# Patient Record
Sex: Male | Born: 1970 | Race: Black or African American | Hispanic: No | Marital: Single | Smoking: Never smoker
Health system: Southern US, Community
[De-identification: ages and names within clinical notes are randomized; demographics above are authoritative.]

## PROBLEM LIST (undated history)

## (undated) DIAGNOSIS — I10 Essential (primary) hypertension: Secondary | ICD-10-CM

## (undated) HISTORY — PX: TONSILLECTOMY: SUR1361

---

## 2003-02-15 ENCOUNTER — Emergency Department (HOSPITAL_COMMUNITY): Admission: EM | Admit: 2003-02-15 | Discharge: 2003-02-15 | Payer: Self-pay | Admitting: Emergency Medicine

## 2005-11-04 ENCOUNTER — Emergency Department (HOSPITAL_COMMUNITY): Admission: EM | Admit: 2005-11-04 | Discharge: 2005-11-04 | Payer: Self-pay | Admitting: Emergency Medicine

## 2007-08-01 ENCOUNTER — Emergency Department (HOSPITAL_COMMUNITY): Admission: EM | Admit: 2007-08-01 | Discharge: 2007-08-01 | Payer: Self-pay | Admitting: Emergency Medicine

## 2008-08-25 ENCOUNTER — Emergency Department (HOSPITAL_COMMUNITY): Admission: EM | Admit: 2008-08-25 | Discharge: 2008-08-25 | Payer: Self-pay | Admitting: Emergency Medicine

## 2009-09-23 ENCOUNTER — Emergency Department (HOSPITAL_COMMUNITY): Admission: EM | Admit: 2009-09-23 | Discharge: 2009-09-23 | Payer: Self-pay | Admitting: Emergency Medicine

## 2010-12-25 ENCOUNTER — Ambulatory Visit
Admission: RE | Admit: 2010-12-25 | Discharge: 2010-12-25 | Disposition: A | Discharge status: Home / Self Care | Payer: 59 | Source: Ambulatory Visit | Attending: Family Medicine | Admitting: Family Medicine

## 2010-12-25 ENCOUNTER — Other Ambulatory Visit: Payer: Self-pay | Admitting: Family Medicine

## 2010-12-25 DIAGNOSIS — R059 Cough, unspecified: Secondary | ICD-10-CM

## 2010-12-25 DIAGNOSIS — IMO0001 Reserved for inherently not codable concepts without codable children: Secondary | ICD-10-CM

## 2010-12-25 DIAGNOSIS — R05 Cough: Secondary | ICD-10-CM

## 2012-01-30 IMAGING — CR DG KNEE COMPLETE 4+V*R*
4 series · 4 of 4 positions shown · non-contrast
Comparison: Right knee radiographs performed 11/04/2005

CLINICAL DATA: Status post fall while dancing; right anterior knee
pain.

RIGHT KNEE - COMPLETE 4+ VIEW

[t knee ap right]
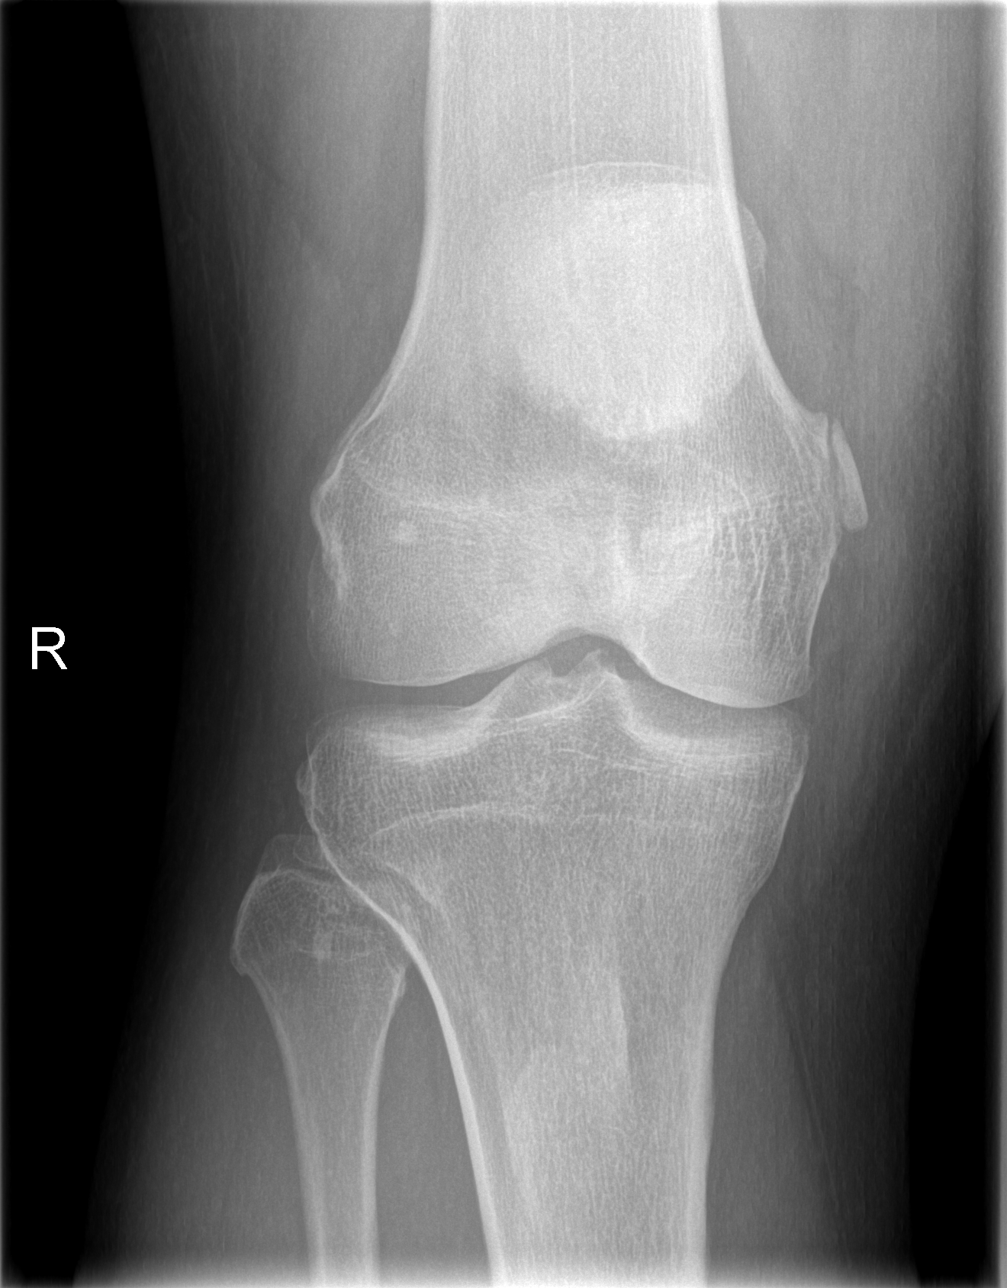

[t knee oblique right (1 of 2)]
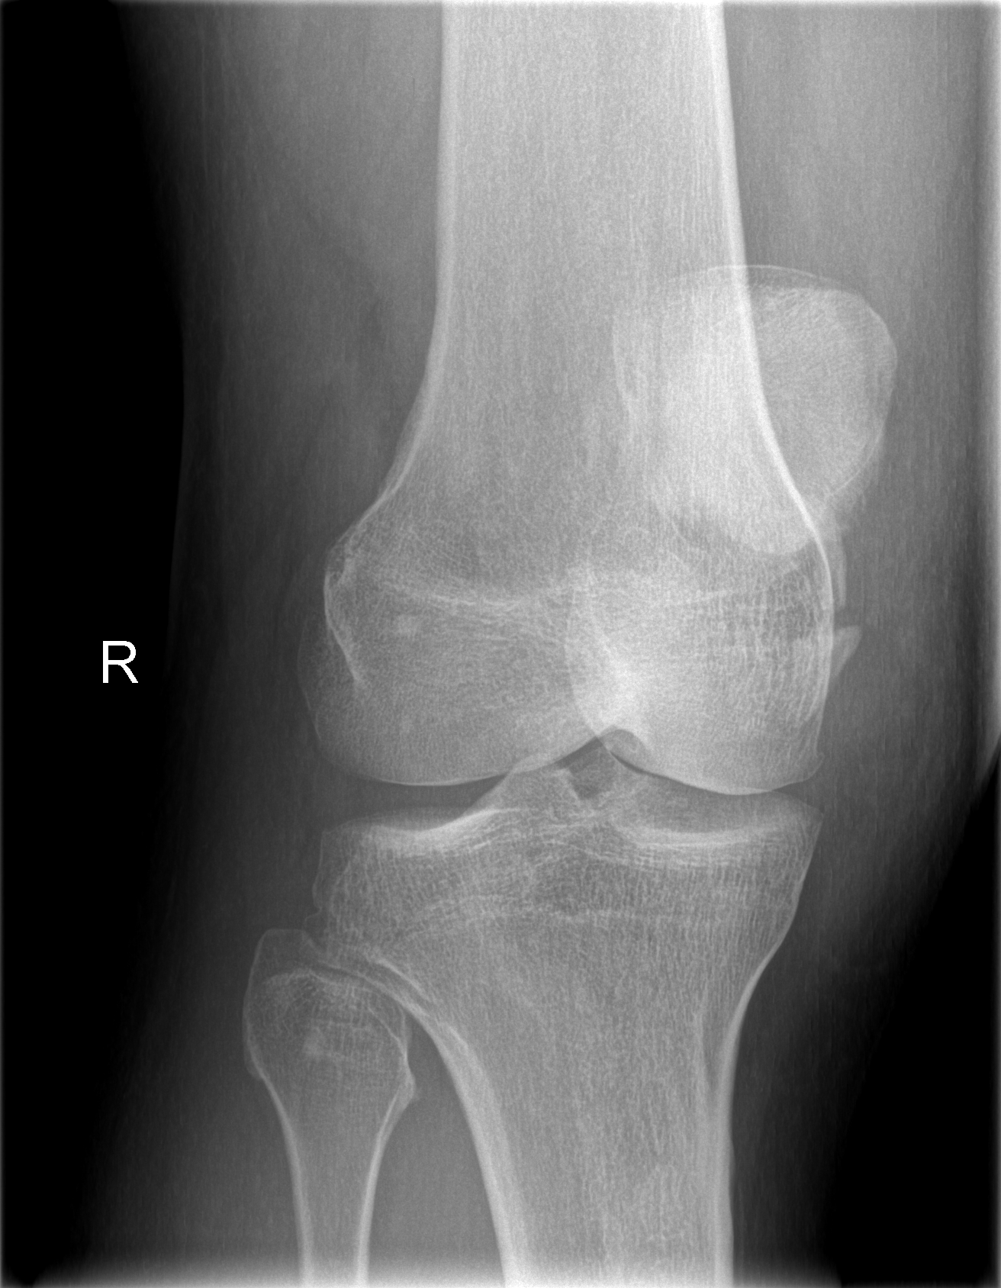

[t knee oblique right (2 of 2)]
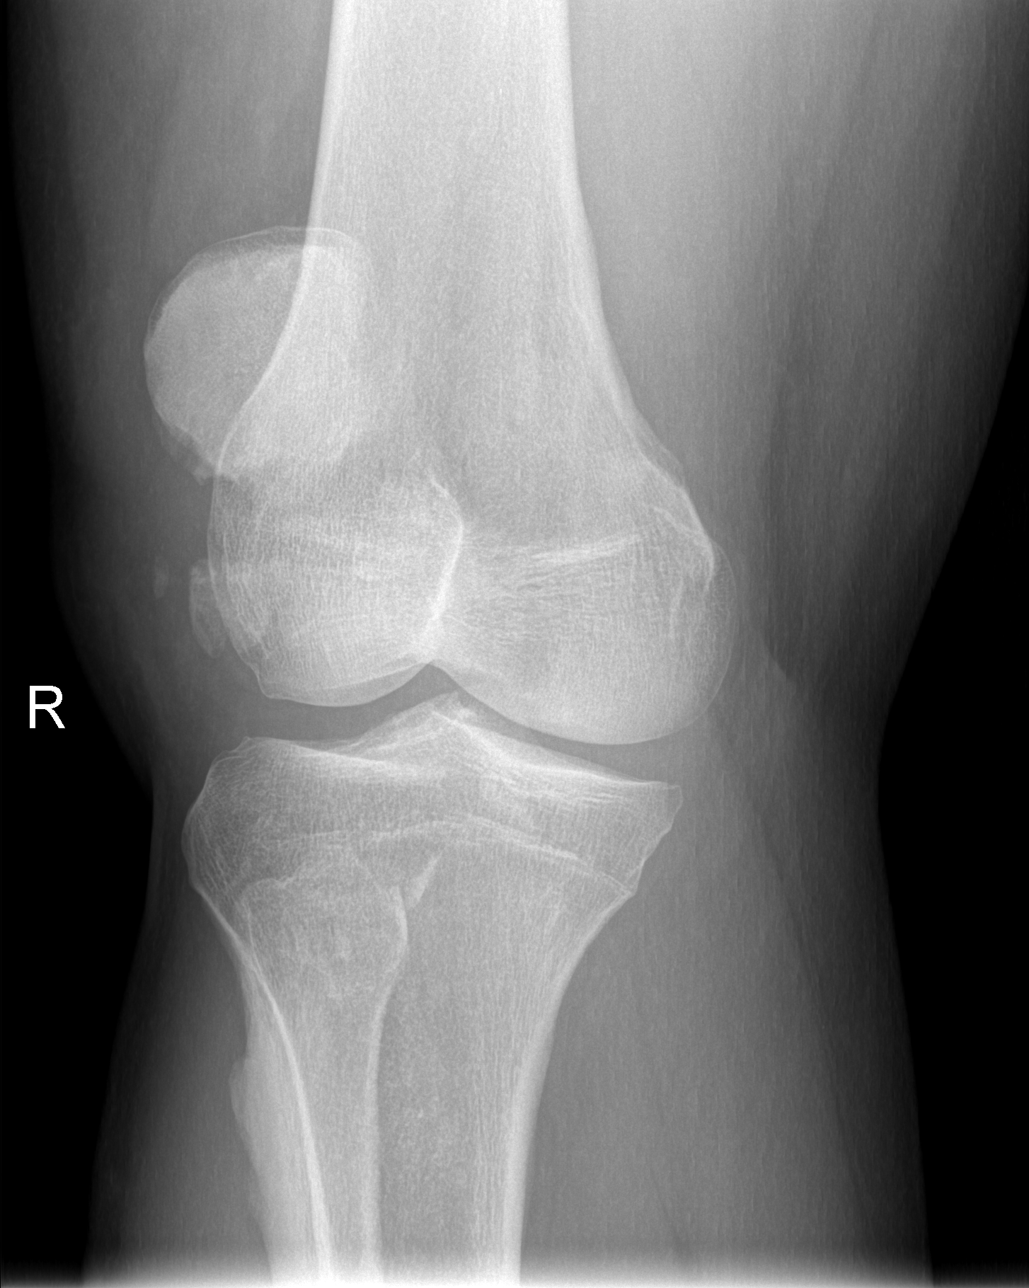

[t knee lat right]
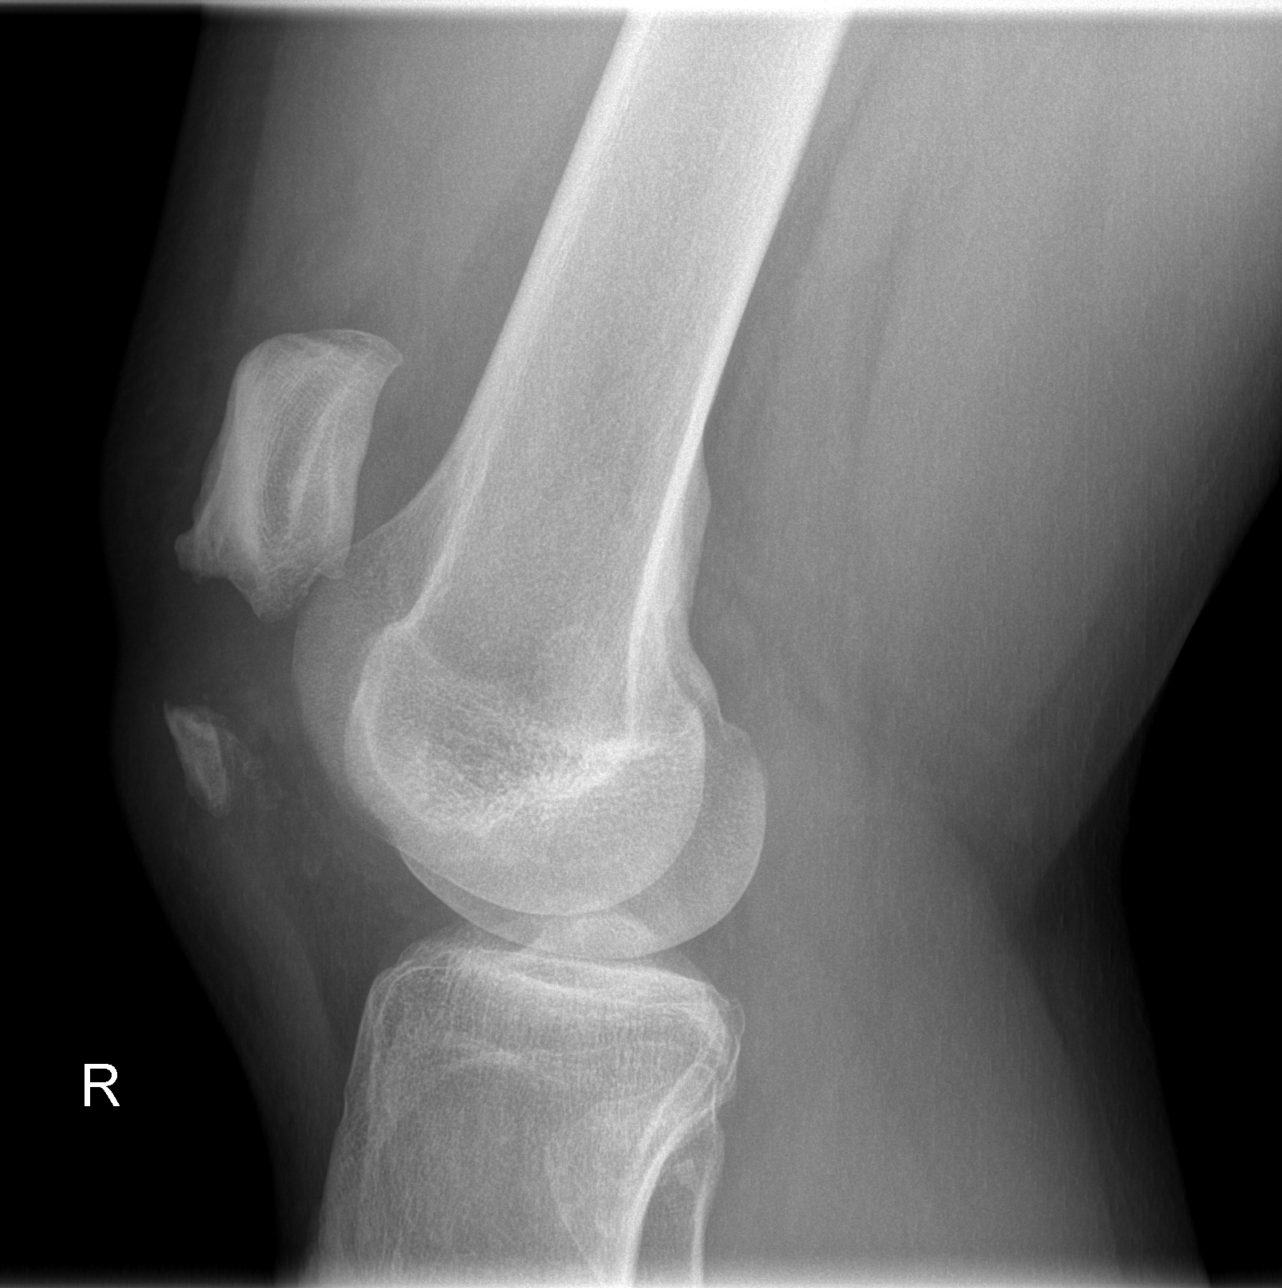

[4 of 4 positions shown; findings below may reference images not displayed]

FINDINGS: There is avulsion of the inferior portion of the
patient's bipartite patella.  New from the prior study, there is
now 2.4 cm inferior displacement of the avulsed fragment.  There is
corresponding superior migration of the majority of the patella.

A Pellegrini-Stieda lesion is noted, reflecting prior medial
collateral ligament injury.  The joint spaces are grossly
preserved.  A small joint effusion is likely present.

The visualized soft tissues are otherwise unremarkable in
appearance.
IMPRESSION: 1.  New avulsion of the inferior portion of the patient's bipartite
patella, with 2.4 cm of separation between the avulsed fragment and
the majority of the patella.
2.  Pellegrini-Stieda lesion again noted, reflecting prior medial
collateral ligament injury.
3.  Likely small joint effusion.

## 2013-05-02 IMAGING — CR DG CHEST 2V
2 series · 2 of 2 positions shown · non-contrast
Comparison: None.

CLINICAL DATA: Cough, congestion

CHEST - 2 VIEW

[w chest pa]
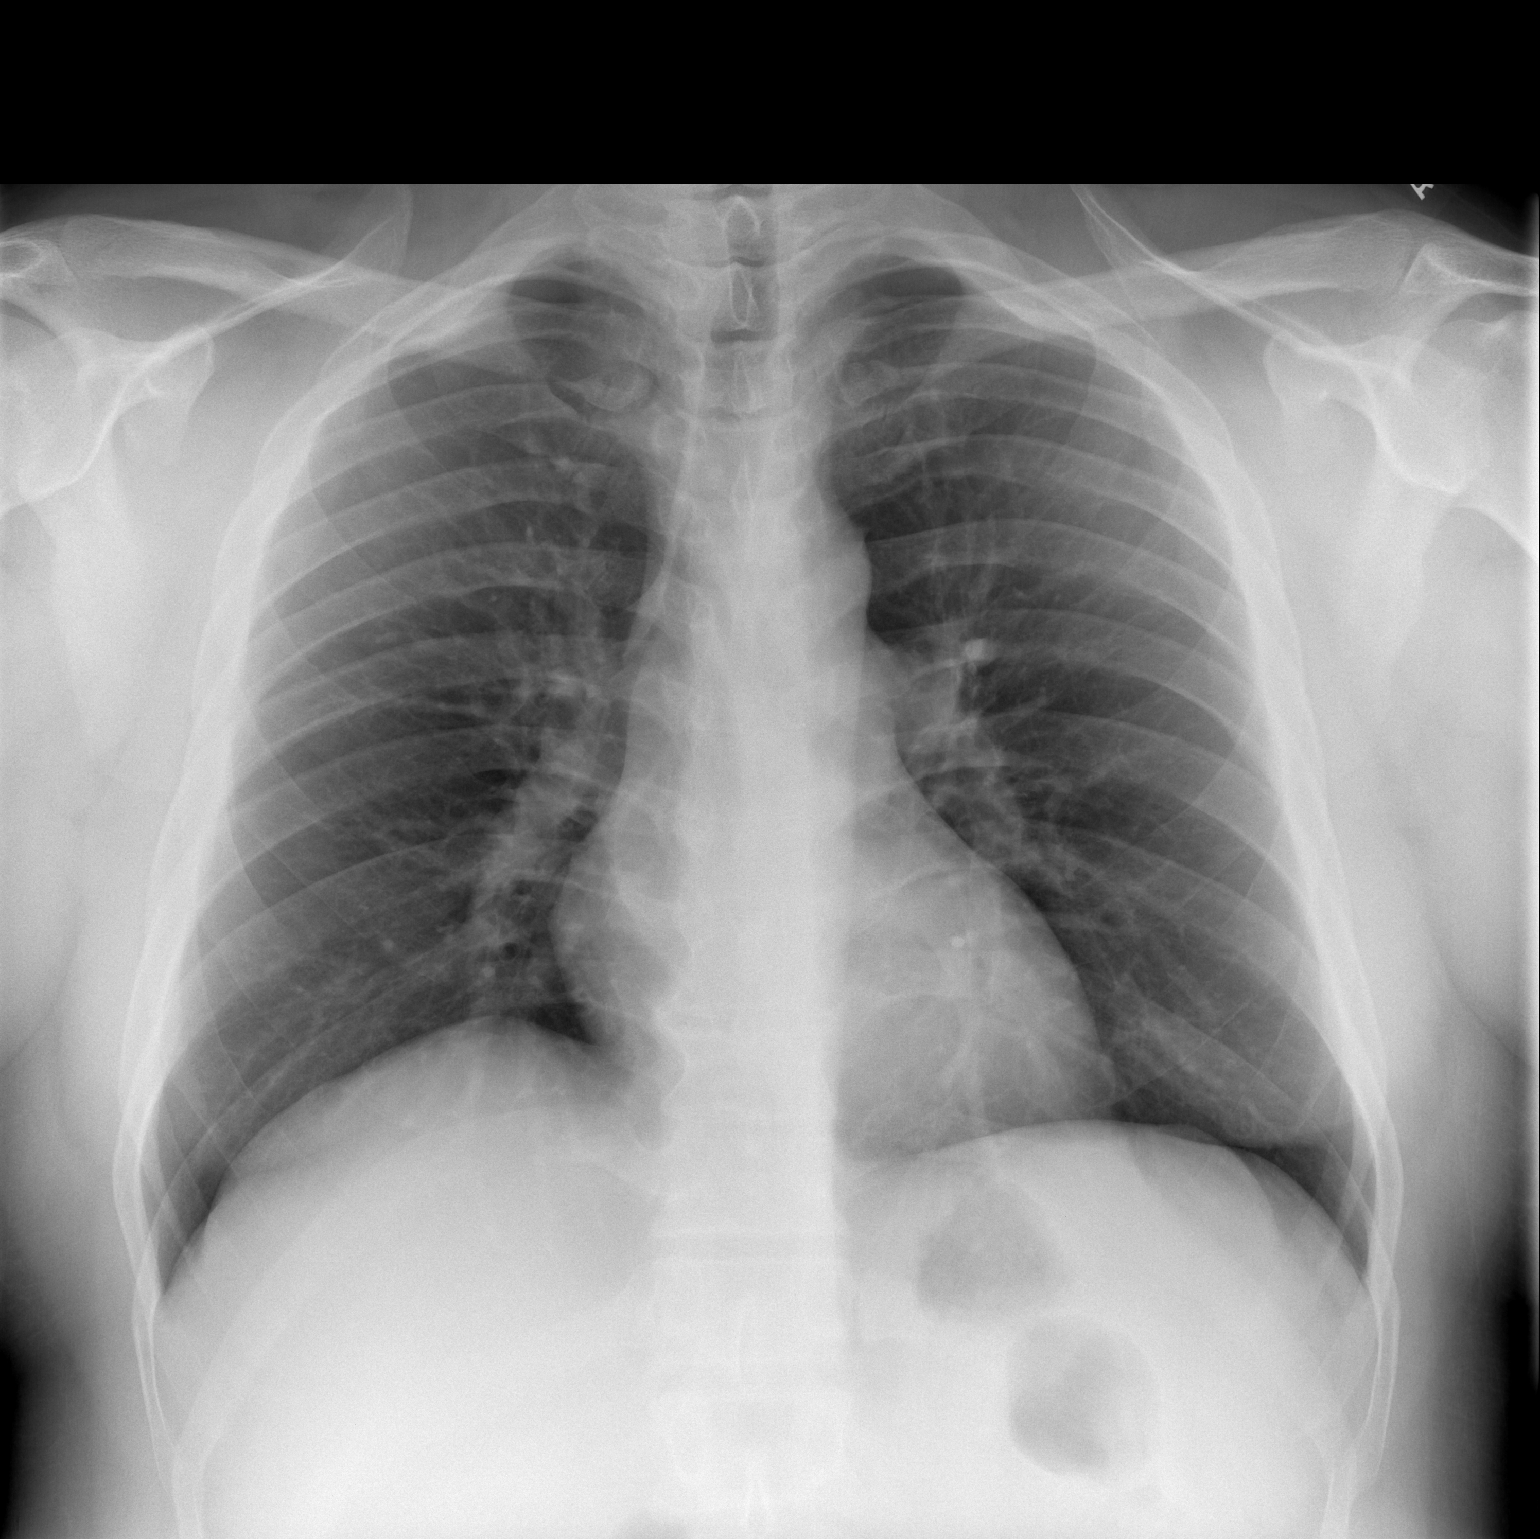

[w chest lat]
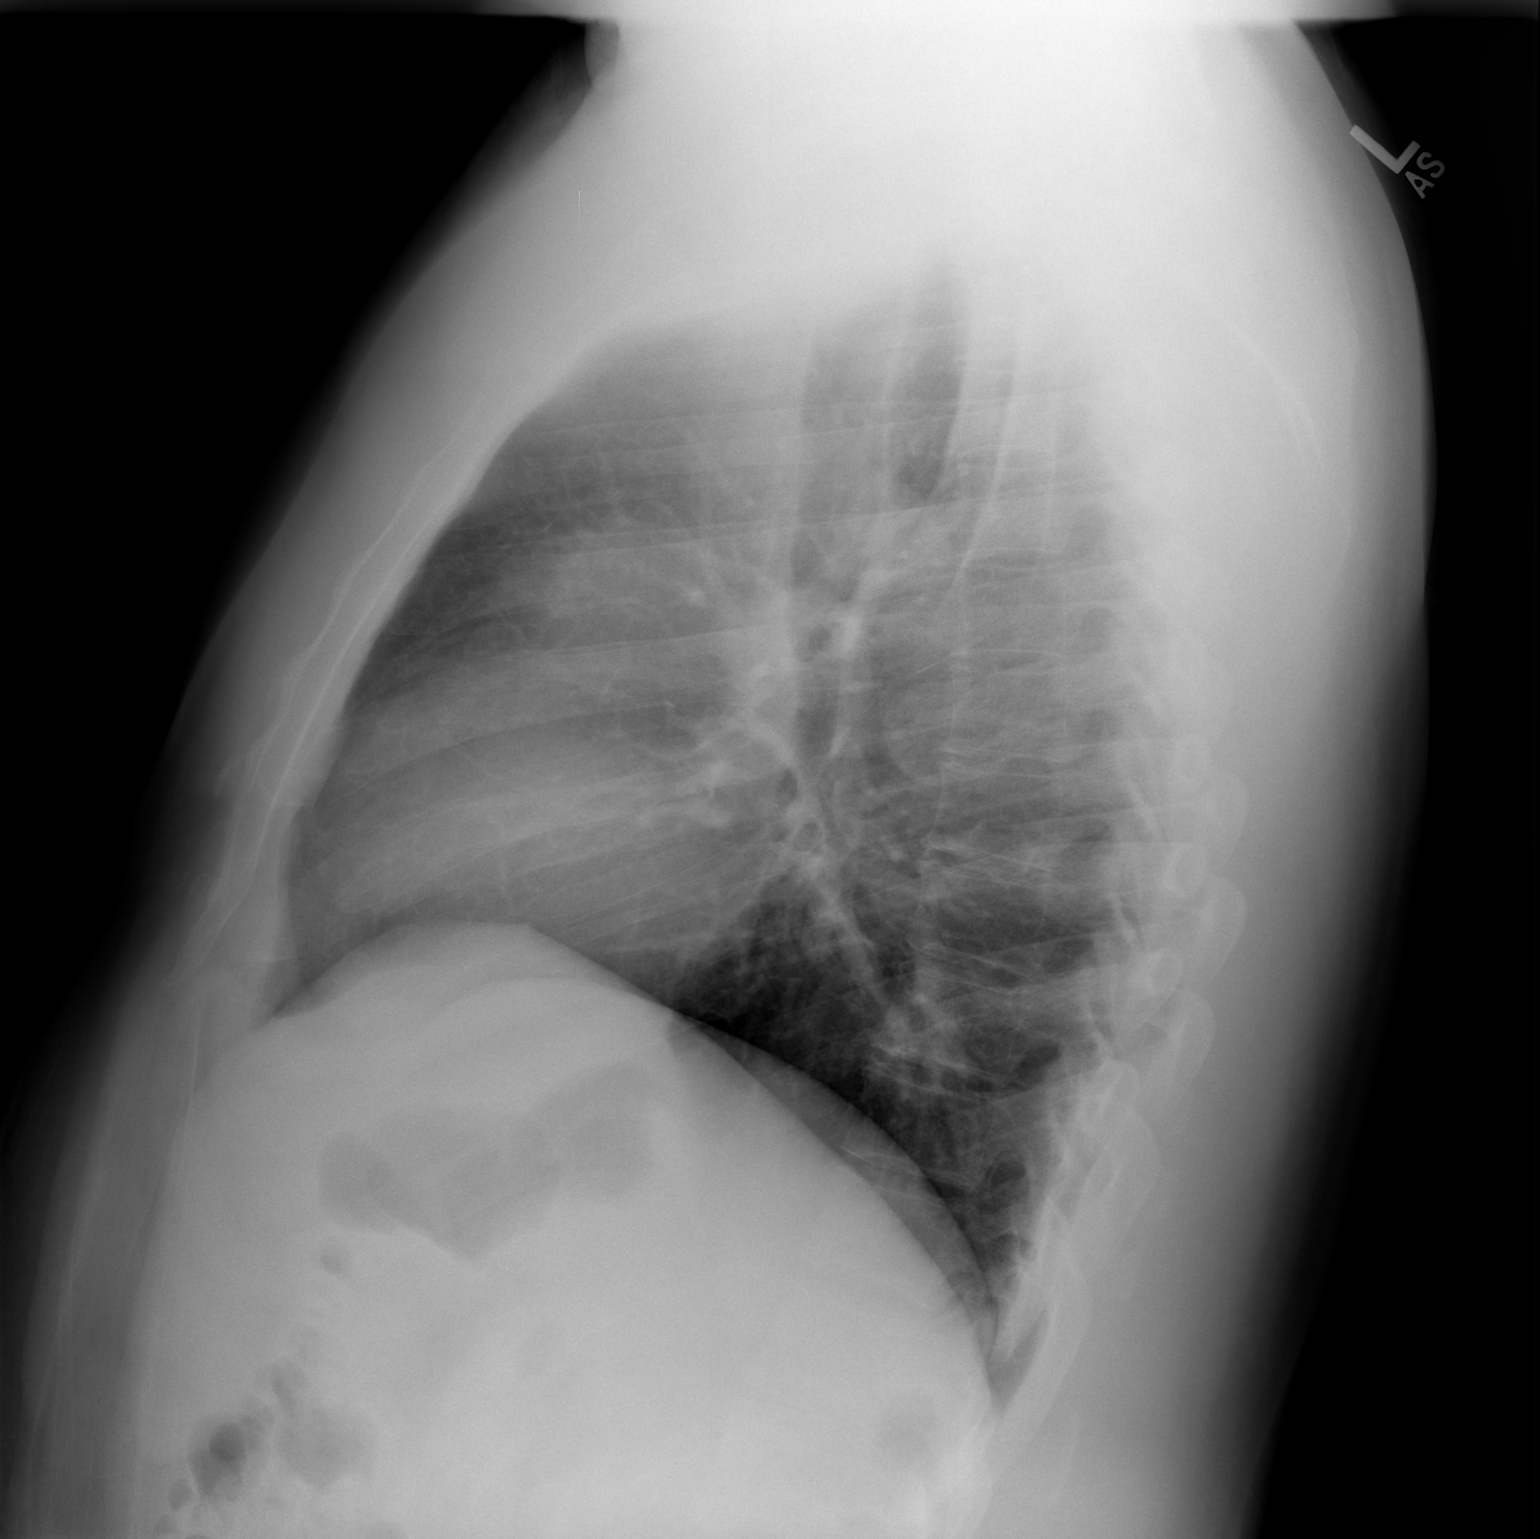

[2 of 2 positions shown; findings below may reference images not displayed]

FINDINGS: The lungs are clear.  Mediastinal contours appear normal.
The heart is within normal limits in size.  No bony abnormality is
seen.
IMPRESSION: No active lung disease.

## 2015-11-02 ENCOUNTER — Encounter: Payer: Self-pay | Admitting: Physician Assistant

## 2015-11-02 ENCOUNTER — Ambulatory Visit (INDEPENDENT_AMBULATORY_CARE_PROVIDER_SITE_OTHER): Payer: Commercial Managed Care - HMO | Admitting: Physician Assistant

## 2015-11-02 VITALS — BP 144/80 | HR 98 | Temp 98.5°F | Resp 18 | Wt 330.0 lb

## 2015-11-02 DIAGNOSIS — N4829 Other inflammatory disorders of penis: Secondary | ICD-10-CM | POA: Diagnosis not present

## 2015-11-02 DIAGNOSIS — N4821 Abscess of corpus cavernosum and penis: Secondary | ICD-10-CM

## 2015-11-02 DIAGNOSIS — N481 Balanitis: Secondary | ICD-10-CM

## 2015-11-02 MED ORDER — DOXYCYCLINE HYCLATE 100 MG PO CAPS: 100.0000 mg | ORAL_CAPSULE | Freq: Two times a day (BID) | ORAL | 0 refills | Status: AC

## 2015-11-02 MED ORDER — FLUCONAZOLE 100 MG PO TABS: 100.0000 mg | ORAL_TABLET | Freq: Every day | ORAL | 0 refills | Status: AC

## 2015-11-02 NOTE — Patient Instructions (Addendum)
Use a warm compress on penis, daily.  Please take 200 mg of Diflucan the first day, then 161m once a day. This is a 21 day course of treatment.  You can use KY jelly, or equivalent, to protect the head of your penis from rubbing on your pants.   Return to clinic in two or three days if you are not better.   Thank you for coming in today. I hope you feel we met your needs.  Feel free to call [facility] if you have any questions or further requests.  Please consider signing up for MyChart if you do not already have it, as this is a great way to communicate with me.  Best,  Whitney McVey, PA-C  IF you received an x-ray today, you will receive an invoice from [facility]. Please contact [facility] at [phone] questions or concerns regarding your invoice.   IF you received labwork today, you will receive an invoice from [employer] Please contact [facility] at [phone] questions or concerns regarding your invoice.   Our billing staff will not be able to assist you with questions regarding bills from these companies.  You will be contacted with the lab results as soon as they are available. The fastest way to get your results is to activate your My Chart account. Instructions are located on the last page of this paperwork. If you have not heard from [facility] the results in 2 weeks, please contact this office.    Balanitis Balanitis is inflammation of the head of the penis (glans).  CAUSES  Balanitis has multiple causes, both infectious and noninfectious. Often balanitis is the result of poor personal hygiene, especially in uncircumcised males. Without adequate washing, viruses, bacteria, and yeast collect between the foreskin and the glans. This can cause an infection. Lack of air and irritation from a normal secretion called smegma contribute to the cause in uncircumcised males. Other causes include:  Chemical irritation from the use  of certain soaps and shower gels (especially soaps with perfumes), condoms, personal lubricants, petroleum jelly, spermicides, and fabric conditioners.  Skin conditions, such as eczema, dermatitis, and psoriasis.  Allergies to drugs, such as tetracycline and sulfa.  Certain medical conditions, including liver cirrhosis, congestive heart failure, and kidney disease.  Morbid obesity. RISK FACTORS  Diabetes mellitus.  A tight foreskin that is difficult to pull back past the glans (phimosis).  Sex without the use of a condom. SIGNS AND SYMPTOMS  Symptoms may include:  Discharge coming from under the foreskin.  Tenderness.  Itching and inability to get an erection (because of the pain).  Redness and a rash.  Sores on the glans and on the foreskin. DIAGNOSIS Diagnosis of balanitis is confirmed through a physical exam. TREATMENT The treatment is based on the cause of the balanitis. Treatment may include:  Frequent cleansing.  Keeping the glans and foreskin dry.  Use of medicines such as creams, pain medicines, antibiotics, or medicines to treat fungal infections.  Sitz baths. If the irritation has caused a scar on the foreskin that prevents easy retraction, a circumcision may be recommended.  HOME CARE INSTRUCTIONS  Sex should be avoided until the condition has cleared. MAKE SURE YOU:  Understand these instructions.  Will watch your condition.  Will get help right away if you are not doing well or get worse.   This information is not intended to replace advice given to you by your health care provider. Make sure you discuss any  questions you have with your health care provider.   Document Released: 06/29/2008 Document Revised: 02/15/2013 Document Reviewed: 08/02/2012 Elsevier Interactive Patient Education [employer].

## 2015-11-02 NOTE — Progress Notes (Signed)
   Francis MoronLinwood Crookshanks  MRN: [id number] DOB: 12/25/1970  PCP: No primary care provider on file.  Subjective:  Pt is 45 y.o. Male presenting to clinic for bumps on penis x one week.    Reports suffering from a chapped head of penis for two months. [facility] with anti-fungal cream. He says it gets better and worse. Has not entirely cleared up.   Last week he noticed two bumps near the head of his penis. No pain, mostly discomfort. Worse when it hits against his shorts.  Never had this before. Hasn't tried anything.  Denies drainage, weeping, itching, pain, burning, urinary symptoms.  Unprotected sex. Married.   PCP [facility]  Review of Systems  Constitutional: Negative for chills, diaphoresis, fatigue and fever.  Respiratory: Negative.   Cardiovascular: Negative.   Gastrointestinal: Negative.   Genitourinary: Positive for penile pain. Negative for difficulty urinating, discharge, dysuria, frequency, penile swelling, scrotal swelling and testicular pain.  Skin: Negative.  Negative for rash.    There are no active problems to display for this patient.   No current outpatient prescriptions on file prior to visit.   No current facility-administered medications on file prior to visit.     No Known Allergies  Objective:  BP (!) 144/80 (BP Location: Right Arm, Patient Position: Sitting, Cuff Size: Large) Comment: 144/80  Pulse 98   Temp 98.5 F (36.9 C) (Oral)   Resp 18   Wt (!) 330 lb (149.7 kg)   SpO2 99%   Physical Exam  Constitutional: He is oriented to person, place, and time and well-developed, well-nourished, and in no distress. No distress.  obese  Cardiovascular: Normal rate, regular rhythm and normal heart sounds.   Genitourinary:  Genitourinary Comments: Fluctuant mass dorsal penis, 1x1cm. Nodular pustule proximal to glans penis, dorsally. No drainage.   Neurological: He is alert and oriented to person, place, and time. GCS score is 15.  Skin:  Skin is warm and dry.  Psychiatric: Mood, memory, affect and judgment normal.  Vitals reviewed.   Assessment and Plan :  1. Balanitis - fluconazole (DIFLUCAN) 100 MG tablet; Take 1 tablet (100 mg total) by mouth daily.  Dispense: 1 tablet; Refill: 0 -Please take 200 mg of Diflucan the first day, then 100mg  once a day. This is a 21 day course of treatment.   2. Abscess, penis - doxycycline (VIBRAMYCIN) 100 MG capsule; Take 1 capsule (100 mg total) by mouth 2 (two) times daily.  Dispense: 20 capsule; Refill: 0 - Supportive therapy: Use a warm compress on penis, daily.  You can use KY jelly, or equivalent, to protect the head of your penis from rubbing on your pants.   - Return to clinic in two or three days if not better.    Marco CollieWhitney Odaliz Mcqueary, PA-C  [facility] 11/02/2015 12:35 PM

## 2016-02-29 DIAGNOSIS — Z719 Counseling, unspecified: Secondary | ICD-10-CM | POA: Diagnosis not present

## 2016-03-07 DIAGNOSIS — Z719 Counseling, unspecified: Secondary | ICD-10-CM | POA: Diagnosis not present

## 2016-03-14 DIAGNOSIS — Z719 Counseling, unspecified: Secondary | ICD-10-CM | POA: Diagnosis not present

## 2016-03-21 DIAGNOSIS — Z719 Counseling, unspecified: Secondary | ICD-10-CM | POA: Diagnosis not present

## 2016-03-28 DIAGNOSIS — Z719 Counseling, unspecified: Secondary | ICD-10-CM | POA: Diagnosis not present

## 2016-04-04 DIAGNOSIS — Z719 Counseling, unspecified: Secondary | ICD-10-CM | POA: Diagnosis not present

## 2016-04-11 DIAGNOSIS — Z719 Counseling, unspecified: Secondary | ICD-10-CM | POA: Diagnosis not present

## 2016-04-18 DIAGNOSIS — Z719 Counseling, unspecified: Secondary | ICD-10-CM | POA: Diagnosis not present

## 2016-07-08 DIAGNOSIS — E7800 Pure hypercholesterolemia, unspecified: Secondary | ICD-10-CM | POA: Diagnosis not present

## 2016-07-08 DIAGNOSIS — I10 Essential (primary) hypertension: Secondary | ICD-10-CM | POA: Diagnosis not present

## 2016-08-05 DIAGNOSIS — I10 Essential (primary) hypertension: Secondary | ICD-10-CM | POA: Diagnosis not present

## 2016-09-24 DIAGNOSIS — R079 Chest pain, unspecified: Secondary | ICD-10-CM | POA: Diagnosis not present

## 2016-09-24 DIAGNOSIS — I10 Essential (primary) hypertension: Secondary | ICD-10-CM | POA: Diagnosis not present

## 2016-09-25 ENCOUNTER — Other Ambulatory Visit: Payer: Self-pay | Admitting: Physician Assistant

## 2016-09-25 DIAGNOSIS — R079 Chest pain, unspecified: Secondary | ICD-10-CM

## 2016-10-07 ENCOUNTER — Ambulatory Visit (INDEPENDENT_AMBULATORY_CARE_PROVIDER_SITE_OTHER): Payer: 59

## 2016-10-07 DIAGNOSIS — R079 Chest pain, unspecified: Secondary | ICD-10-CM

## 2016-10-07 LAB — EXERCISE TOLERANCE TEST
Estimated workload: 8.5 METS
Exercise duration (min): 7 min
Exercise duration (sec): 0 s
MPHR: 174 {beats}/min
Peak HR: 151 {beats}/min
Percent HR: 86 %
RPE: 17
Rest HR: 93 {beats}/min

## 2017-02-09 DIAGNOSIS — Z0000 Encounter for general adult medical examination without abnormal findings: Secondary | ICD-10-CM | POA: Diagnosis not present

## 2017-02-09 DIAGNOSIS — E7800 Pure hypercholesterolemia, unspecified: Secondary | ICD-10-CM | POA: Diagnosis not present

## 2017-02-09 DIAGNOSIS — I10 Essential (primary) hypertension: Secondary | ICD-10-CM | POA: Diagnosis not present

## 2017-02-09 DIAGNOSIS — R21 Rash and other nonspecific skin eruption: Secondary | ICD-10-CM | POA: Diagnosis not present

## 2017-02-13 DIAGNOSIS — E291 Testicular hypofunction: Secondary | ICD-10-CM | POA: Diagnosis not present

## 2017-05-22 DIAGNOSIS — R5383 Other fatigue: Secondary | ICD-10-CM | POA: Diagnosis not present

## 2017-05-22 DIAGNOSIS — R202 Paresthesia of skin: Secondary | ICD-10-CM | POA: Diagnosis not present

## 2017-06-29 DIAGNOSIS — R946 Abnormal results of thyroid function studies: Secondary | ICD-10-CM | POA: Diagnosis not present

## 2017-08-10 DIAGNOSIS — Z23 Encounter for immunization: Secondary | ICD-10-CM | POA: Diagnosis not present

## 2017-08-10 DIAGNOSIS — I10 Essential (primary) hypertension: Secondary | ICD-10-CM | POA: Diagnosis not present

## 2017-08-10 DIAGNOSIS — E559 Vitamin D deficiency, unspecified: Secondary | ICD-10-CM | POA: Diagnosis not present

## 2017-08-10 DIAGNOSIS — E7800 Pure hypercholesterolemia, unspecified: Secondary | ICD-10-CM | POA: Diagnosis not present

## 2018-03-24 DIAGNOSIS — I10 Essential (primary) hypertension: Secondary | ICD-10-CM | POA: Diagnosis not present

## 2018-03-24 DIAGNOSIS — E7800 Pure hypercholesterolemia, unspecified: Secondary | ICD-10-CM | POA: Diagnosis not present

## 2018-03-24 DIAGNOSIS — Z0000 Encounter for general adult medical examination without abnormal findings: Secondary | ICD-10-CM | POA: Diagnosis not present

## 2018-05-04 DIAGNOSIS — M25561 Pain in right knee: Secondary | ICD-10-CM | POA: Diagnosis not present

## 2018-05-04 DIAGNOSIS — M25562 Pain in left knee: Secondary | ICD-10-CM | POA: Diagnosis not present

## 2019-04-29 ENCOUNTER — Ambulatory Visit: Payer: 59

## 2019-05-05 ENCOUNTER — Ambulatory Visit: Payer: 59 | Attending: Internal Medicine

## 2019-05-05 DIAGNOSIS — Z23 Encounter for immunization: Secondary | ICD-10-CM

## 2019-05-05 NOTE — Progress Notes (Signed)
   Covid-19 Vaccination Clinic  Name:  Francis Lowery    MRN: [id number] DOB: 03-14-1970  05/05/2019  [name] was observed post Covid-19 immunization for 15 minutes without incident. He was provided with Vaccine Information Sheet and instruction to access the V-Safe system.   [name] was instructed to call 911 with any severe reactions post vaccine: Marland Kitchen Difficulty breathing  . Swelling of face and throat  . A fast heartbeat  . A bad rash all over body  . Dizziness and weakness   Immunizations Administered    Name Date Dose VIS Date Route   Pfizer COVID-19 Vaccine 05/05/2019 10:12 AM 0.3 mL 02/04/2019 Intramuscular   Manufacturer: [employer], [employer]   Lot: [id number]   NDC: [address]-9

## 2019-05-30 ENCOUNTER — Ambulatory Visit: Payer: 59 | Attending: Internal Medicine

## 2019-05-30 DIAGNOSIS — Z23 Encounter for immunization: Secondary | ICD-10-CM

## 2019-05-30 NOTE — Progress Notes (Signed)
   Covid-19 Vaccination Clinic  Name:  Blaze Nylund    MRN: [id number] DOB: 15-Nov-1970  05/30/2019  Mr. Beville was observed post Covid-19 immunization for 15 minutes without incident. He was provided with Vaccine Information Sheet and instruction to access the V-Safe system.   Mr. Heber was instructed to call 911 with any severe reactions post vaccine: Marland Kitchen Difficulty breathing  . Swelling of face and throat  . A fast heartbeat  . A bad rash all over body  . Dizziness and weakness   Immunizations Administered    Name Date Dose VIS Date Route   Pfizer COVID-19 Vaccine 05/30/2019  9:14 AM 0.3 mL 02/04/2019 Intramuscular   Manufacturer: [employer], [employer]   Lot: [id number]   NDC: [address]-4

## 2020-05-25 ENCOUNTER — Other Ambulatory Visit: Payer: Self-pay | Admitting: Family Medicine

## 2020-05-25 ENCOUNTER — Ambulatory Visit
Admission: RE | Admit: 2020-05-25 | Discharge: 2020-05-25 | Disposition: A | Discharge status: Home / Self Care | Payer: 59 | Source: Ambulatory Visit | Attending: Family Medicine | Admitting: Family Medicine

## 2020-05-25 DIAGNOSIS — R0600 Dyspnea, unspecified: Secondary | ICD-10-CM

## 2020-05-25 DIAGNOSIS — R0609 Other forms of dyspnea: Secondary | ICD-10-CM

## 2022-04-11 ENCOUNTER — Emergency Department (HOSPITAL_BASED_OUTPATIENT_CLINIC_OR_DEPARTMENT_OTHER): Payer: 59

## 2022-04-11 ENCOUNTER — Other Ambulatory Visit: Payer: Self-pay

## 2022-04-11 ENCOUNTER — Other Ambulatory Visit (HOSPITAL_BASED_OUTPATIENT_CLINIC_OR_DEPARTMENT_OTHER): Payer: Self-pay

## 2022-04-11 ENCOUNTER — Emergency Department (HOSPITAL_BASED_OUTPATIENT_CLINIC_OR_DEPARTMENT_OTHER)
Admission: EM | Admit: 2022-04-11 | Discharge: 2022-04-11 | Disposition: A | Discharge status: Home / Self Care | Payer: 59 | Attending: Emergency Medicine | Admitting: Emergency Medicine

## 2022-04-11 ENCOUNTER — Encounter (HOSPITAL_BASED_OUTPATIENT_CLINIC_OR_DEPARTMENT_OTHER): Payer: Self-pay

## 2022-04-11 DIAGNOSIS — N200 Calculus of kidney: Secondary | ICD-10-CM

## 2022-04-11 DIAGNOSIS — R1031 Right lower quadrant pain: Secondary | ICD-10-CM | POA: Diagnosis present

## 2022-04-11 DIAGNOSIS — N132 Hydronephrosis with renal and ureteral calculous obstruction: Secondary | ICD-10-CM | POA: Diagnosis not present

## 2022-04-11 HISTORY — DX: Essential (primary) hypertension: I10

## 2022-04-11 LAB — URINALYSIS, ROUTINE W REFLEX MICROSCOPIC
Bacteria, UA: NONE SEEN
Bilirubin Urine: NEGATIVE
Glucose, UA: NEGATIVE mg/dL
Ketones, ur: NEGATIVE mg/dL
Leukocytes,Ua: NEGATIVE
Nitrite: NEGATIVE
Protein, ur: 30 mg/dL — AB
RBC / HPF: >50 RBC/hpf (ref 0–5)
Specific Gravity, Urine: 1.01 (ref 1.005–1.030)
pH: 5.5 (ref 5.0–8.0)

## 2022-04-11 LAB — BASIC METABOLIC PANEL
Anion gap: 11 (ref 5–15)
BUN: 17 mg/dL (ref 6–20)
CO2: 22 mmol/L (ref 22–32)
Calcium: 9.3 mg/dL (ref 8.9–10.3)
Chloride: 104 mmol/L (ref 98–111)
Creatinine, Ser: 1.17 mg/dL (ref 0.61–1.24)
GFR, Estimated: >60 mL/min (ref >60)
Glucose, Bld: 103 mg/dL — ABNORMAL HIGH (ref 70–99)
Potassium: 4.1 mmol/L (ref 3.5–5.1)
Sodium: 137 mmol/L (ref 135–145)

## 2022-04-11 MED ORDER — ONDANSETRON HCL 4 MG/2ML IJ SOLN
4.0000 mg | Freq: Once | INTRAMUSCULAR | Status: AC
  Administered 2022-04-11: 4 mg via INTRAVENOUS
  Filled 2022-04-11: qty 2

## 2022-04-11 MED ORDER — OXYCODONE-ACETAMINOPHEN 5-325 MG PO TABS
1.0000 | ORAL_TABLET | Freq: Four times a day (QID) | ORAL | 0 refills | Status: AC | PRN
  Filled 2022-04-11: qty 15, 4d supply, fill #0

## 2022-04-11 MED ORDER — HYDROMORPHONE HCL 1 MG/ML IJ SOLN
1.0000 mg | Freq: Once | INTRAMUSCULAR | Status: AC
  Administered 2022-04-11: 1 mg via INTRAVENOUS
  Filled 2022-04-11: qty 1

## 2022-04-11 NOTE — ED Provider Notes (Signed)
[facility] Provider Note   CSN: [id number] Arrival date & time: 04/11/22  [id number]     History  Chief Complaint  Patient presents with   Flank Pain    Francis Lowery is a 52 y.o. male.  52 year old male presents with sudden onset of right-sided flank pain which awoke him this morning.  Pain characterized as sharp and worse with certain movements.  Does radiate down to his groin on the right side.  Denies any testicular pain or swelling.  Has had some urinary urgency.  Denies any dysuria.  No fever or chills.  Has had nausea no vomiting.  No prior history of same.  No treatment use prior to arrival      Home Medications Prior to Admission medications   Medication Sig Start Date End Date Taking? Authorizing Provider  doxycycline (VIBRAMYCIN) 100 MG capsule Take 1 capsule (100 mg total) by mouth 2 (two) times daily. 11/02/15   McVey, Gelene Mink, PA-C  lisinopril (PRINIVIL,ZESTRIL) 10 MG tablet Take 10 mg by mouth daily.    [provider]      Allergies    Patient has no known allergies.    Review of Systems   Review of Systems  All other systems reviewed and are negative.   Physical Exam Updated Vital Signs BP (!) 154/74 (BP Location: Left Arm)   Pulse 84   Temp 98.5 F (36.9 C) (Oral)   Resp (!) 22   Ht 1.854 m (6' 1"$ )   Wt (!) 152.4 kg   SpO2 97%   BMI 44.33 kg/m  Physical Exam Vitals and nursing note reviewed.  Constitutional:      General: He is not in acute distress.    Appearance: Normal appearance. He is well-developed. He is not toxic-appearing.  HENT:     Head: Normocephalic and atraumatic.  Eyes:     General: Lids are normal.     Conjunctiva/sclera: Conjunctivae normal.     Pupils: Pupils are equal, round, and reactive to light.  Neck:     Thyroid: No thyroid mass.     Trachea: No tracheal deviation.  Cardiovascular:     Rate and Rhythm: Normal rate and regular rhythm.     Heart sounds:  Normal heart sounds. No murmur heard.    No gallop.  Pulmonary:     Effort: Pulmonary effort is normal. No respiratory distress.     Breath sounds: Normal breath sounds. No stridor. No decreased breath sounds, wheezing, rhonchi or rales.  Abdominal:     General: There is no distension.     Palpations: Abdomen is soft.     Tenderness: There is no abdominal tenderness. There is no rebound.  Musculoskeletal:        General: No tenderness. Normal range of motion.     Cervical back: Normal range of motion and neck supple.       Back:  Skin:    General: Skin is warm and dry.     Findings: No abrasion or rash.  Neurological:     Mental Status: He is alert and oriented to person, place, and time. Mental status is at baseline.     GCS: GCS eye subscore is 4. GCS verbal subscore is 5. GCS motor subscore is 6.     Cranial Nerves: No cranial nerve deficit.     Sensory: No sensory deficit.     Motor: Motor function is intact.  Psychiatric:  Attention and Perception: Attention normal.        Speech: Speech normal.        Behavior: Behavior normal.    ED Results / Procedures / Treatments   Labs (all labs ordered are listed, but only abnormal results are displayed) Labs Reviewed  URINALYSIS, [place] PANEL    EKG None  Radiology No results found.  Procedures Procedures    Medications Ordered in ED Medications  HYDROmorphone (DILAUDID) injection 1 mg (has no administration in time range)  ondansetron (ZOFRAN) injection 4 mg (has no administration in time range)    ED Course/ Medical Decision Making/ A&P                             Medical Decision Making Amount and/or Complexity of Data Reviewed Labs: ordered. Radiology: ordered.  Risk Prescription drug management.   Patient medicated for pain here and feels better.  Suspect that patient has kidney stone.  No suspicion for torsion.  CT renal stone did show mild right-sided  hydronephrosis.  There was a 2 mm stone noted in the bladder per my review interpretation.  Patient's urinalysis negative for infection.  Pain is well-controlled here.  Will be discharged home with urological referral.        Final Clinical Impression(s) / ED Diagnoses Final diagnoses:  None    Rx / DC Orders ED Discharge Orders     None         Lacretia Leigh, MD 04/11/22 1001

## 2022-04-11 NOTE — ED Triage Notes (Signed)
Onset last night woke up with right back pain and right groin pain.  States urination is a "trickle"  States having difficulty with bowel movement.  Last bowel movement yesterday.

## 2023-01-13 ENCOUNTER — Emergency Department (HOSPITAL_BASED_OUTPATIENT_CLINIC_OR_DEPARTMENT_OTHER): Payer: 59 | Admitting: Radiology

## 2023-01-13 ENCOUNTER — Other Ambulatory Visit: Payer: Self-pay

## 2023-01-13 ENCOUNTER — Emergency Department (HOSPITAL_BASED_OUTPATIENT_CLINIC_OR_DEPARTMENT_OTHER)
Admission: EM | Admit: 2023-01-13 | Discharge: 2023-01-13 | Disposition: A | Discharge status: Home / Self Care | Payer: 59 | Attending: Emergency Medicine | Admitting: Emergency Medicine

## 2023-01-13 ENCOUNTER — Encounter (HOSPITAL_BASED_OUTPATIENT_CLINIC_OR_DEPARTMENT_OTHER): Payer: Self-pay | Admitting: *Deleted

## 2023-01-13 DIAGNOSIS — R052 Subacute cough: Secondary | ICD-10-CM | POA: Diagnosis not present

## 2023-01-13 DIAGNOSIS — R600 Localized edema: Secondary | ICD-10-CM | POA: Diagnosis not present

## 2023-01-13 DIAGNOSIS — R059 Cough, unspecified: Secondary | ICD-10-CM | POA: Diagnosis present

## 2023-01-13 DIAGNOSIS — R0602 Shortness of breath: Secondary | ICD-10-CM | POA: Insufficient documentation

## 2023-01-13 DIAGNOSIS — Z79899 Other long term (current) drug therapy: Secondary | ICD-10-CM | POA: Insufficient documentation

## 2023-01-13 DIAGNOSIS — I10 Essential (primary) hypertension: Secondary | ICD-10-CM | POA: Diagnosis not present

## 2023-01-13 LAB — COMPREHENSIVE METABOLIC PANEL
ALT: 28 U/L (ref 0–44)
AST: 25 U/L (ref 15–41)
Albumin: 4.5 g/dL (ref 3.5–5.0)
Alkaline Phosphatase: 57 U/L (ref 38–126)
Anion gap: 6 (ref 5–15)
BUN: 18 mg/dL (ref 6–20)
CO2: 31 mmol/L (ref 22–32)
Calcium: 10 mg/dL (ref 8.9–10.3)
Chloride: 103 mmol/L (ref 98–111)
Creatinine, Ser: 1 mg/dL (ref 0.61–1.24)
GFR, Estimated: >60 mL/min (ref >60)
Glucose, Bld: 91 mg/dL (ref 70–99)
Potassium: 4.4 mmol/L (ref 3.5–5.1)
Sodium: 140 mmol/L (ref 135–145)
Total Bilirubin: 0.8 mg/dL (ref <1.2)
Total Protein: 8.4 g/dL — ABNORMAL HIGH (ref 6.5–8.1)

## 2023-01-13 LAB — CBC WITH DIFFERENTIAL/PLATELET
Abs Immature Granulocytes: 0.01 10*3/uL (ref 0.00–0.07)
Basophils Absolute: 0 10*3/uL (ref 0.0–0.1)
Basophils Relative: 0 %
Eosinophils Absolute: 0.2 10*3/uL (ref 0.0–0.5)
Eosinophils Relative: 5 %
HCT: 44.9 % (ref 39.0–52.0)
Hemoglobin: 14.1 g/dL (ref 13.0–17.0)
Immature Granulocytes: 0 %
Lymphocytes Relative: 51 %
Lymphs Abs: 2.7 10*3/uL (ref 0.7–4.0)
MCH: 27.3 pg (ref 26.0–34.0)
MCHC: 31.4 g/dL (ref 30.0–36.0)
MCV: 86.8 fL (ref 80.0–100.0)
Monocytes Absolute: 0.6 10*3/uL (ref 0.1–1.0)
Monocytes Relative: 12 %
Neutro Abs: 1.7 10*3/uL (ref 1.7–7.7)
Neutrophils Relative %: 32 %
Platelets: 292 10*3/uL (ref 150–400)
RBC: 5.17 MIL/uL (ref 4.22–5.81)
RDW: 13.6 % (ref 11.5–15.5)
WBC: 5.4 10*3/uL (ref 4.0–10.5)
nRBC: 0 % (ref 0.0–0.2)

## 2023-01-13 LAB — TROPONIN I (HIGH SENSITIVITY): Troponin I (High Sensitivity): 7 ng/L (ref <18)

## 2023-01-13 LAB — BRAIN NATRIURETIC PEPTIDE: B Natriuretic Peptide: 7.5 pg/mL (ref 0.0–100.0)

## 2023-01-13 MED ORDER — GUAIFENESIN 100 MG/5ML PO LIQD: 100.0000 mg | ORAL | 0 refills | Status: AC | PRN

## 2023-01-13 MED ORDER — BENZONATATE 100 MG PO CAPS: 100.0000 mg | ORAL_CAPSULE | Freq: Three times a day (TID) | ORAL | 0 refills | Status: AC

## 2023-01-13 NOTE — ED Notes (Addendum)
Pt transported to XRay 

## 2023-01-13 NOTE — Discharge Instructions (Signed)
Please read and follow all provided instructions.  Your diagnoses today include:  1. Subacute cough   2. Bilateral lower extremity edema     Tests performed today include: Complete blood cell count: No abnormalities Complete metabolic panel: No abnormalities, normal liver and kidney function Cardiac enzymes (blood test looking for stress on the heart): Was normal EKG: No concerning findings Chest x-ray: No pneumonia or fluid buildup Blood test for heart failure was negative Vital signs. See below for your results today.   Medications prescribed:  Tessalon Perles - cough suppressant medication  Guaifenesin cough syrup -cough suppressant medication  Take any prescribed medications only as directed.  Home care instructions:  Follow any educational materials contained in this packet.  BE VERY CAREFUL not to take multiple medicines containing Tylenol (also called acetaminophen). Doing so can lead to an overdose which can damage your liver and cause liver failure and possibly death.   Follow-up instructions: Please follow-up with your primary care provider in the next 3 days for further evaluation of your symptoms.   Return instructions:  Please return to the Emergency Department if you experience worsening symptoms.  Return if you develop worsening chest pain, worsening shortness of breath, if you feel lightheaded or pass out, have new symptoms or other concerns. Please return if you have any other emergent concerns.  Additional Information:  Your vital signs today were: BP 135/79   Pulse 89   Temp 98.7 F (37.1 C) (Oral)   Resp 18   Wt (!) 152.4 kg   SpO2 98%   BMI 44.33 kg/m  If your blood pressure (BP) was elevated above 135/85 this visit, please have this repeated by your doctor within one month. --------------

## 2023-01-13 NOTE — ED Triage Notes (Signed)
Here by POV, sent from [facility], endorses cough x3 weeks, also sob, nasal and chest congestion, BLE edema, and wheezing. H/o HTN, missed some doses of BP meds (amlodipine and valsartan). Denies pain, HA, CP, NVD, fever. Alert, NAD, calm, interactive, resps mildly labored, worse with exertion. Steady gait.

## 2023-01-13 NOTE — ED Provider Notes (Signed)
[facility] AT [facility] Provider Note   CSN: [id number] Arrival date & time: 01/13/23  4098     History  Chief Complaint  Patient presents with   Shortness of Breath    Francis Lowery is a 52 y.o. male.  Patient with history of hypertension, aortic atherosclerosis noted on CT -- presents from [facility] for eval of cough, swelling, SOB.  Patient reports having a cough for about 3 to 4 weeks.  Over the past 3 to 4 days he has developed shortness of breath that is worse with exertion.  He has been having trouble lying flat at night, waking up frequently.  He finds it more comfortable to sleep sitting up in recliner.  He did not really notice swelling in his legs but when he went to urgent care today they noticed some mild edema.  Patient denies chest pain.  No family history of heart disease or personal history of heart or lung disease.  He does not smoke or use tobacco.  No abdominal pain.       Home Medications Prior to Admission medications   Medication Sig Start Date End Date Taking? Authorizing Provider  amLODipine (NORVASC) 5 MG tablet Take 5 mg by mouth daily. 04/07/22   [provider]  doxycycline (VIBRAMYCIN) 100 MG capsule Take 1 capsule (100 mg total) by mouth 2 (two) times daily. 11/02/15   McVey, Madelaine Bhat, PA-C  lisinopril (PRINIVIL,ZESTRIL) 10 MG tablet Take 10 mg by mouth daily.    [provider]  oxyCODONE-acetaminophen (PERCOCET/ROXICET) 5-325 MG tablet Take 1 tablet by mouth every 6 (six) hours as needed for severe pain. 04/11/22   Lorre Nick, MD  valsartan (DIOVAN) 320 MG tablet Take 320 mg by mouth daily. 03/10/22   [provider]      Allergies    Patient has no known allergies.    Review of Systems   Review of Systems  Physical Exam Updated Vital Signs BP 135/79   Pulse 89   Temp 98.7 F (37.1 C) (Oral)   Resp 18   Wt (!) 152.4 kg   SpO2 98%   BMI 44.33 kg/m   Physical Exam Vitals  and nursing note reviewed.  Constitutional:      Appearance: He is well-developed. He is not diaphoretic.  HENT:     Head: Normocephalic and atraumatic.     Mouth/Throat:     Mouth: Mucous membranes are not dry.  Eyes:     Conjunctiva/sclera: Conjunctivae normal.  Neck:     Vascular: Normal carotid pulses. No carotid bruit or JVD.     Trachea: Trachea normal. No tracheal deviation.  Cardiovascular:     Rate and Rhythm: Normal rate and regular rhythm.     Pulses: No decreased pulses.          Radial pulses are 2+ on the right side and 2+ on the left side.     Heart sounds: Normal heart sounds, S1 normal and S2 normal. Heart sounds not distant. No murmur heard. Pulmonary:     Effort: Pulmonary effort is normal. No respiratory distress.     Breath sounds: Normal breath sounds. No wheezing, rhonchi or rales.     Comments: Lungs are clear, bases clear. Chest:     Chest wall: No tenderness.  Abdominal:     General: Bowel sounds are normal.     Palpations: Abdomen is soft.     Tenderness: There is no abdominal tenderness. There is no guarding  or rebound.  Musculoskeletal:     Cervical back: Normal range of motion and neck supple. No muscular tenderness.     Right lower leg: Edema present.     Left lower leg: Edema present.     Comments: Trace to 1+ pitting edema of the ankles, symmetric, bilateral  Skin:    General: Skin is warm and dry.     Coloration: Skin is not pale.  Neurological:     Mental Status: He is alert. Mental status is at baseline.  Psychiatric:        Mood and Affect: Mood normal.     ED Results / Procedures / Treatments   Labs (all labs ordered are listed, but only abnormal results are displayed) Labs Reviewed  COMPREHENSIVE METABOLIC PANEL - Abnormal; Notable for the following components:      Result Value   Total Protein 8.4 (*)    All other components within normal limits  CBC WITH DIFFERENTIAL/PLATELET  BRAIN NATRIURETIC PEPTIDE  TROPONIN I (HIGH  SENSITIVITY)    EKG EKG Interpretation Date/Time:  Tuesday January 13 2023 09:33:49 EST Ventricular Rate:  96 PR Interval:  133 QRS Duration:  113 QT Interval:  368 QTC Calculation: 465 R Axis:   184  Text Interpretation: Sinus rhythm Borderline intraventricular conduction delay Confirmed by Alvester Chou [phone]) on 01/13/2023 9:35:48 AM  Radiology DG Chest 2 View  Result Date: 01/13/2023 CLINICAL DATA:  cough, ? mild chf. EXAM: CHEST - 2 VIEW COMPARISON:  05/25/2020. FINDINGS: Redemonstration of elevated right hemidiaphragm. There are atelectatic changes at the right lung base. Bilateral lungs otherwise clear. No acute consolidation or lung collapse. Bilateral costophrenic angles are clear. No pneumothorax. No pulmonary edema. Stable cardio-mediastinal silhouette. No acute osseous abnormalities. The soft tissues are within normal limits. IMPRESSION: *No active cardiopulmonary disease. Elevated right hemidiaphragm with compressive atelectatic changes in the right lung base. Electronically Signed   By: Jules Schick M.D.   On: 01/13/2023 13:40    Procedures Procedures    Medications Ordered in ED Medications - No data to display  ED Course/ Medical Decision Making/ A&P    Patient seen and examined. History obtained directly from patient.   Labs/EKG: Ordered CBC, CMP, troponin, BNP, EKG  Imaging: Ordered chest x-ray.  Medications/Fluids: None ordered  Most recent vital signs reviewed and are as follows: BP (!) 161/96 (BP Location: Right Arm)   Pulse 84   Temp 98.7 F (37.1 C) (Oral)   Resp 20   Wt (!) 152.4 kg   SpO2 93%   BMI 44.33 kg/m   Initial impression: Cough, edema, clinical features concerning for mild CHF   Reassessment performed. Patient appears stable, comfortable.   Labs personally reviewed and interpreted including: CBC unremarkable; CMP normal liver function tests, no renal dysfunction, no low protein or albumin; troponin normal; BNP is  normal.  Imaging personally visualized and interpreted including: Chest x-ray pending.  Reviewed pertinent lab work and imaging with patient at bedside. Questions answered.   Plan: Awaiting chest x-ray results.  Overall workup reassuring, not suggestive of CHF.   1:52 PM Reassessment performed. Patient appears stable.  Comfortable.  Imaging personally visualized and interpreted including: Chest x-ray, no signs of edema or heart failure, pneumonia.  Reviewed pertinent lab work and imaging with patient at bedside. Questions answered.   Most current vital signs reviewed and are as follows: BP 135/79   Pulse 89   Temp 98.7 F (37.1 C) (Oral)   Resp 18   Wt Marland Kitchen)  152.4 kg   SpO2 98%   BMI 44.33 kg/m   Plan: Discharge to home.   Prescriptions written for: Tessalon, guaifenesin for cough suppression  Other home care instructions discussed: Avoidance of triggers  ED return instructions discussed: Worsening symptoms, worsening shortness of breath, chest pain, fever, new or worsening symptoms  Follow-up instructions discussed: Patient encouraged to follow-up with their PCP in 3 days.                                     Medical Decision Making Amount and/or Complexity of Data Reviewed Labs: ordered. Radiology: ordered.  Risk OTC drugs. Prescription drug management.   Patient was sent to the emergency department today over concern for congestive heart failure.  Fortunately the patient's workup here has been reassuring.  Normal BNP, chest x-ray, troponin.  In regards to the edema, patient has normal kidney and liver function.  He has not been hypoxic or tachycardic.  He does have a subacute cough.  This may be infectious related but also consider medication, allergic.  This can be followed up as an outpatient.  Do not feel that patient requires diuretics at this time.  He can elevate legs and follow-up with PCP.  The patient's vital signs, pertinent lab work and imaging were  reviewed and interpreted as discussed in the ED course. Hospitalization was considered for further testing, treatments, or serial exams/observation. However as patient is well-appearing, has a stable exam, and reassuring studies today, I do not feel that they warrant admission at this time. This plan was discussed with the patient who verbalizes agreement and comfort with this plan and seems reliable and able to return to the Emergency Department with worsening or changing symptoms.          Final Clinical Impression(s) / ED Diagnoses Final diagnoses:  Subacute cough  Bilateral lower extremity edema    Rx / DC Orders ED Discharge Orders          Ordered    benzonatate (TESSALON) 100 MG capsule  Every 8 hours        01/13/23 1348    guaiFENesin (ROBITUSSIN) 100 MG/5ML liquid  Every 4 hours PRN        01/13/23 1348              Renne Crigler, PA-C 01/13/23 1355    Terald Sleeper, MD 01/13/23 [phone]

## 2024-02-02 ENCOUNTER — Encounter: Payer: Self-pay | Admitting: Podiatry

## 2024-02-02 ENCOUNTER — Ambulatory Visit: Admitting: Podiatry

## 2024-02-02 DIAGNOSIS — B353 Tinea pedis: Secondary | ICD-10-CM

## 2024-02-02 DIAGNOSIS — B351 Tinea unguium: Secondary | ICD-10-CM

## 2024-02-02 DIAGNOSIS — M79671 Pain in right foot: Secondary | ICD-10-CM

## 2024-02-02 MED ORDER — KETOCONAZOLE 2 % EX CREA: TOPICAL_CREAM | CUTANEOUS | 9 refills | Status: AC

## 2024-02-02 MED ORDER — TERBINAFINE HCL 250 MG PO TABS: 250.0000 mg | ORAL_TABLET | Freq: Every day | ORAL | 1 refills | Status: AC

## 2024-02-02 NOTE — Progress Notes (Signed)
   Subjective:    HPI Presents with complaint of thick painful nails that causes discomfort with  walking and wearing shoes.  Have been this way for many years and have been getting worse.  Also complains of increased cracking and splitting under the toes and between the toes.  Itches and stings.  Feet and skin on the bottom of the feet is dry bilaterally.   Objective:  Physical Exam   General: AAO x3, NAD  Vascular: DP and PT pulses palpable bilaterally.  Immedate capillary fill time digits. No significant lower extremity edema bilaterally.  Dermatological:  Onychomycotic mycotic changes nails 1 through 5 with discoloration nail and subungual debris and thickening of the nail with redness along the nail folds.  Tenderness with pressure on nail plates..  Dry scaly erythematous rash moccasin distribution feet bilaterally.  Maceration and splitting and cracking and sulcus in between the digits.  Neruologic:  Grossly intact B/L.  Achilles tendon reflex normal bilaterally  Musculoskeletal:  Normal lower extremity muscle strength.  Assessment:  Painful onychomycotic nails 1 through 5 bilaterally. Pain feet b/l Tinea pedis bilaterally     Plan:  - New patient office visit level 3 for evaluation and management -Discussed with with patient onychomycosis and etiology and treatment.  Discussed topical versus oral agents.  Discussed risk and benefits of both.  No history of hepatic or renal disease.  Patient would like to start oral Lamisil  treatment.  Explained that we would check labs every 6 weeks during course of treatment to monitor for any side effects. -Discussed the tinea pedis and etiology and treatment. -Rx: Lamisil  250 mg p.o. daily, refill x 1 -Rx ketoconazole  cream 2%, apply 1 g to each foot twice daily, 9 refills -Labs ordered today for liver function tests to monitor for any hepatic side effects from the Lamisil .  Return 6 weeks Lamisil  3 and labs

## 2024-02-15 ENCOUNTER — Other Ambulatory Visit: Payer: Self-pay | Admitting: Lab

## 2024-02-15 ENCOUNTER — Other Ambulatory Visit: Payer: Self-pay | Admitting: Podiatry

## 2024-02-15 DIAGNOSIS — Z79899 Other long term (current) drug therapy: Secondary | ICD-10-CM

## 2024-02-16 LAB — HEPATIC FUNCTION PANEL
ALT: 33 IU/L (ref 0–44)
AST: 26 IU/L (ref 0–40)
Albumin: 4.3 g/dL (ref 3.8–4.9)
Alkaline Phosphatase: 69 IU/L (ref 47–123)
Bilirubin Total: 0.7 mg/dL (ref 0.0–1.2)
Bilirubin, Direct: 0.28 mg/dL (ref 0.00–0.40)
Total Protein: 7.5 g/dL (ref 6.0–8.5)

## 2024-03-15 ENCOUNTER — Ambulatory Visit: Admitting: Podiatry

## 2024-03-15 ENCOUNTER — Encounter: Payer: Self-pay | Admitting: Podiatry

## 2024-03-15 DIAGNOSIS — B351 Tinea unguium: Secondary | ICD-10-CM | POA: Diagnosis not present

## 2024-03-15 DIAGNOSIS — M79671 Pain in right foot: Secondary | ICD-10-CM | POA: Diagnosis not present

## 2024-03-15 DIAGNOSIS — M79672 Pain in left foot: Secondary | ICD-10-CM

## 2024-03-15 MED ORDER — TERBINAFINE HCL 250 MG PO TABS: 250.0000 mg | ORAL_TABLET | Freq: Every day | ORAL | 0 refills | Status: AC

## 2024-03-15 NOTE — Progress Notes (Signed)
   Subjective:    HPI Presents for follow-up onychomycosis treatment with p.o. Lamisil .  No problems taking medicine with no side effects noted.  Tenderness around toes with walking and wearing shoes.   Objective:  Physical Exam   General: AAO x3, NAD  Vascular: DP and PT pulses palpable bilaterally.  Immedate capillary fill time digits. No significant lower extremity edema bilaterally.  Dermatological: Onychomycotic mycotic changes nails 1 through 5 with discoloration nail and subungual debris and thickening of the nail. 0% Clearance of onychomycotic nail changes noted. Tenderness with walking and wearing shoes.  Neruologic: Grossly intact B/L  Musculoskeletal:   Assessment:  Painful onychomycotic nails 1 through 5 bilaterally. Pain feet b/l     Plan:  -Established office visit level 3 for evaluation and management -Patient is tolerating Lamisil  treatment for onychomycotic nails well.  No side effects noted. Will continue this treatment. -Rx: Lamisil  250 mg p.o. daily - Labs ordered today for liver function tests to monitor for any hepatic side effects from the Lamisil .  - Return in 6weeks for  Lamisil  final

## 2024-04-26 ENCOUNTER — Ambulatory Visit: Admitting: Podiatry
